# Patient Record
Sex: Male | Born: 1995 | Race: White | Hispanic: No | Marital: Married | Smoking: Never smoker
Health system: Southern US, Community
[De-identification: ages and names within clinical notes are randomized; demographics above are authoritative.]

---

## 2018-08-06 ENCOUNTER — Emergency Department (HOSPITAL_COMMUNITY)
Admission: EM | Admit: 2018-08-06 | Discharge: 2018-08-06 | Disposition: A | Discharge status: Home / Self Care | Payer: No Typology Code available for payment source | Attending: Emergency Medicine | Admitting: Emergency Medicine

## 2018-08-06 ENCOUNTER — Emergency Department (HOSPITAL_COMMUNITY): Payer: No Typology Code available for payment source

## 2018-08-06 ENCOUNTER — Encounter (HOSPITAL_COMMUNITY): Payer: Self-pay | Admitting: *Deleted

## 2018-08-06 DIAGNOSIS — Y939 Activity, unspecified: Secondary | ICD-10-CM | POA: Insufficient documentation

## 2018-08-06 DIAGNOSIS — W3189XA Contact with other specified machinery, initial encounter: Secondary | ICD-10-CM | POA: Insufficient documentation

## 2018-08-06 DIAGNOSIS — S8991XA Unspecified injury of right lower leg, initial encounter: Secondary | ICD-10-CM | POA: Diagnosis present

## 2018-08-06 DIAGNOSIS — Y999 Unspecified external cause status: Secondary | ICD-10-CM | POA: Diagnosis not present

## 2018-08-06 DIAGNOSIS — Y929 Unspecified place or not applicable: Secondary | ICD-10-CM | POA: Diagnosis not present

## 2018-08-06 DIAGNOSIS — Z23 Encounter for immunization: Secondary | ICD-10-CM | POA: Diagnosis not present

## 2018-08-06 DIAGNOSIS — S81811A Laceration without foreign body, right lower leg, initial encounter: Secondary | ICD-10-CM

## 2018-08-06 MED ORDER — TETANUS-DIPHTH-ACELL PERTUSSIS 5-2.5-18.5 LF-MCG/0.5 IM SUSP
0.5000 mL | Freq: Once | INTRAMUSCULAR | Status: AC
  Administered 2018-08-06: 0.5 mL via INTRAMUSCULAR
  Filled 2018-08-06: qty 0.5

## 2018-08-06 MED ORDER — LIDOCAINE HCL (PF) 1 % IJ SOLN
10.0000 mL | Freq: Once | INTRAMUSCULAR | Status: AC
  Administered 2018-08-06: 10 mL
  Filled 2018-08-06: qty 10

## 2018-08-06 NOTE — Discharge Instructions (Addendum)
Your evaluated in the emergency department for a laceration to your right leg that happened while you are at work.  Your x-ray did not show any obvious fracture or foreign body.  Your wound was sutured closed and the sutures will need to be removed in 10 to 12 days.  You may return to work tomorrow full duty.  Please return sooner if any signs of infection.

## 2018-08-06 NOTE — ED Triage Notes (Signed)
Pt in with laceration to his right knee, bleeding controlled, it was hit by a drill bit while he was trying to drill a whole

## 2018-08-06 NOTE — ED Notes (Signed)
Pt walked out prior to receiving d.c papers, unable to locate pt.

## 2018-08-06 NOTE — ED Provider Notes (Signed)
[facility] EMERGENCY DEPARTMENT Provider Note   CSN: [id number] Arrival date & time: 08/06/18  1222     History   Chief Complaint Chief Complaint  Patient presents with  . Laceration    HPI Danny Owens is a 23 y.o. male.  He is here for an injury from work.  He says he was using a drill that skipped and cut his right leg above his knee.  This happened just prior to arrival.  He is complaining of no pain and no active bleeding.  No distal numbness or weakness.  No difficulty with ambulation.  He is unsure when his last tetanus shot was.  The history is provided by the patient.  Laceration   The incident occurred 1 to 2 hours ago. The laceration is located on the right leg. The laceration is 3 cm in size. Injury mechanism: drill bit. The patient is experiencing no pain. He reports no foreign bodies present. His tetanus status is unknown.    History reviewed. No pertinent past medical history.  There are no active problems to display for this patient.   History reviewed. No pertinent surgical history.      Home Medications    Prior to Admission medications   Not on File    Family History History reviewed. No pertinent family history.  Social History Social History   Tobacco Use  . Smoking status: Not on file  Substance Use Topics  . Alcohol use: Not on file  . Drug use: Not on file     Allergies   Patient has no known allergies.   Review of Systems Review of Systems  Respiratory: Negative for shortness of breath.   Skin: Positive for wound.  Neurological: Negative for weakness and numbness.     Physical Exam Updated Vital Signs BP (!) 155/90 (BP Location: Right Arm)   Pulse 99   Temp 99.7 F (37.6 C) (Oral)   Resp 14   Ht 5\' 8"  (1.727 m)   Wt 97.5 kg   SpO2 98%   BMI 32.69 kg/m   Physical Exam Vitals signs and nursing note reviewed.  Constitutional:      Appearance: He is well-developed.  HENT:     Head:  Normocephalic and atraumatic.  Eyes:     Conjunctiva/sclera: Conjunctivae normal.  Neck:     Musculoskeletal: Neck supple.  Pulmonary:     Effort: Pulmonary effort is normal.  Musculoskeletal: Normal range of motion.        General: Signs of injury present.     Comments: Is approximately 3 and half centimeter laceration just proximal to his right knee.  Extensor mechanism intact.  No obvious foreign body.  No active bleeding.  Skin:    General: Skin is warm and dry.  Neurological:     Mental Status: He is alert.     GCS: GCS eye subscore is 4. GCS verbal subscore is 5. GCS motor subscore is 6.      ED Treatments / Results  Labs (all labs ordered are listed, but only abnormal results are displayed) Labs Reviewed - No data to display  EKG None  Radiology No results found.  Procedures .Marland KitchenLaceration Repair Date/Time: 08/06/2018 2:05 PM Performed by: Terrilee Files, MD Authorized by: Terrilee Files, MD   Consent:    Consent obtained:  Verbal   Consent given by:  Patient   Risks discussed:  Infection, pain, poor cosmetic result, poor wound healing and retained foreign body  Alternatives discussed:  No treatment and delayed treatment Anesthesia (see MAR for exact dosages):    Anesthesia method:  Local infiltration   Local anesthetic:  Lidocaine 1% w/o epi Laceration details:    Location:  Leg   Leg location:  L upper leg   Length (cm):  3.5 Repair type:    Repair type:  Simple Pre-procedure details:    Preparation:  Patient was prepped and draped in usual sterile fashion and imaging obtained to evaluate for foreign bodies Exploration:    Wound exploration: wound explored through full range of motion     Contaminated: no   Treatment:    Area cleansed with:  Saline   Amount of cleaning:  Standard Skin repair:    Repair method:  Sutures   Suture size:  3-0   Suture material:  Nylon   Suture technique:  Simple interrupted   Number of sutures:  4 Approximation:     Approximation:  Close Post-procedure details:    Dressing:  Non-adherent dressing   Patient tolerance of procedure:  Tolerated well, no immediate complications   (including critical care time)  Medications Ordered in ED Medications  lidocaine (PF) (XYLOCAINE) 1 % injection 10 mL (has no administration in time range)  Tdap (BOOSTRIX) injection 0.5 mL (has no administration in time range)     Initial Impression / Assessment and Plan / ED Course  I have reviewed the triage vital signs and the nursing notes.  Pertinent labs & imaging results that were available during my care of the patient were reviewed by me and considered in my medical decision making (see chart for details).       Final Clinical Impressions(s) / ED Diagnoses   Final diagnoses:  Laceration of right lower extremity, initial encounter    ED Discharge Orders    None       Terrilee FilesButler, [place]op[place] Canty C, MD 08/07/18 [phone]

## 2018-08-06 NOTE — ED Notes (Signed)
ED Provider at bedside. 

## 2018-08-07 ENCOUNTER — Encounter (HOSPITAL_COMMUNITY): Payer: Self-pay | Admitting: Emergency Medicine

## 2018-08-18 ENCOUNTER — Other Ambulatory Visit: Payer: Self-pay

## 2018-08-18 ENCOUNTER — Emergency Department (HOSPITAL_COMMUNITY)
Admission: EM | Admit: 2018-08-18 | Discharge: 2018-08-18 | Disposition: A | Discharge status: Home / Self Care | Payer: No Typology Code available for payment source | Attending: Emergency Medicine | Admitting: Emergency Medicine

## 2018-08-18 DIAGNOSIS — Z4802 Encounter for removal of sutures: Secondary | ICD-10-CM | POA: Insufficient documentation

## 2018-08-18 NOTE — ED Provider Notes (Signed)
  [facility] EMERGENCY DEPARTMENT Provider Note   CSN: [id number] Arrival date & time: 08/18/18  1623     History   Chief Complaint Chief Complaint  Patient presents with  . Suture / Staple Removal    HPI Danny Owens is a 23 y.o. male.  The history is provided by the patient. No language interpreter was used.  Suture / Staple Removal  This is a new problem. Episode onset: 2 weeks. The problem occurs constantly. He has tried nothing for the symptoms.  Pt reports no complaints  No past medical history on file.  There are no active problems to display for this patient.   No past surgical history on file.      Home Medications    Prior to Admission medications   Not on File    Family History No family history on file.  Social History Social History   Tobacco Use  . Smoking status: Not on file  Substance Use Topics  . Alcohol use: Not on file  . Drug use: Not on file     Allergies   Patient has no known allergies.   Review of Systems Review of Systems  All other systems reviewed and are negative.    Physical Exam Updated Vital Signs BP (!) 140/91 (BP Location: Right Arm)   Pulse 79   Temp 98.2 F (36.8 C) (Oral)   Resp 16   SpO2 96%   Physical Exam Vitals signs and nursing note reviewed.  Musculoskeletal: Normal range of motion.  Skin:    General: Skin is warm.     Comments: Laceration well healed   Neurological:     General: No focal deficit present.  Psychiatric:        Mood and Affect: Mood normal.      ED Treatments / Results  Labs (all labs ordered are listed, but only abnormal results are displayed) Labs Reviewed - No data to display  EKG None  Radiology No results found.  Procedures Procedures (including critical care time)  Medications Ordered in ED Medications - No data to display   Initial Impression / Assessment and Plan / ED Course  I have reviewed the triage vital signs and the  nursing notes.  Pertinent labs & imaging results that were available during my care of the patient were reviewed by me and considered in my medical decision making (see chart for details).       Final Clinical Impressions(s) / ED Diagnoses   Final diagnoses:  Visit for suture removal    ED Discharge Orders    None       Osie Cheeks 08/18/18 1633    Mesner, Barbara Cower, MD 08/20/18 [phone]

## 2018-08-18 NOTE — ED Triage Notes (Signed)
Pt here for stitches to be removed from lac on right leg two weeks ago. No complaints.

## 2018-08-18 NOTE — ED Notes (Signed)
Patient verbalizes understanding of discharge instructions. Opportunity for questioning and answers were provided. Armband removed by staff, pt discharged from ED.  

## 2019-05-12 IMAGING — CR DG KNEE COMPLETE 4+V*R*
4 series · 4 of 4 positions shown · non-contrast
Comparison: None.

CLINICAL DATA: Anterior right knee laceration while at work.

EXAM:
RIGHT KNEE - COMPLETE 4+ VIEW

[knee ap]
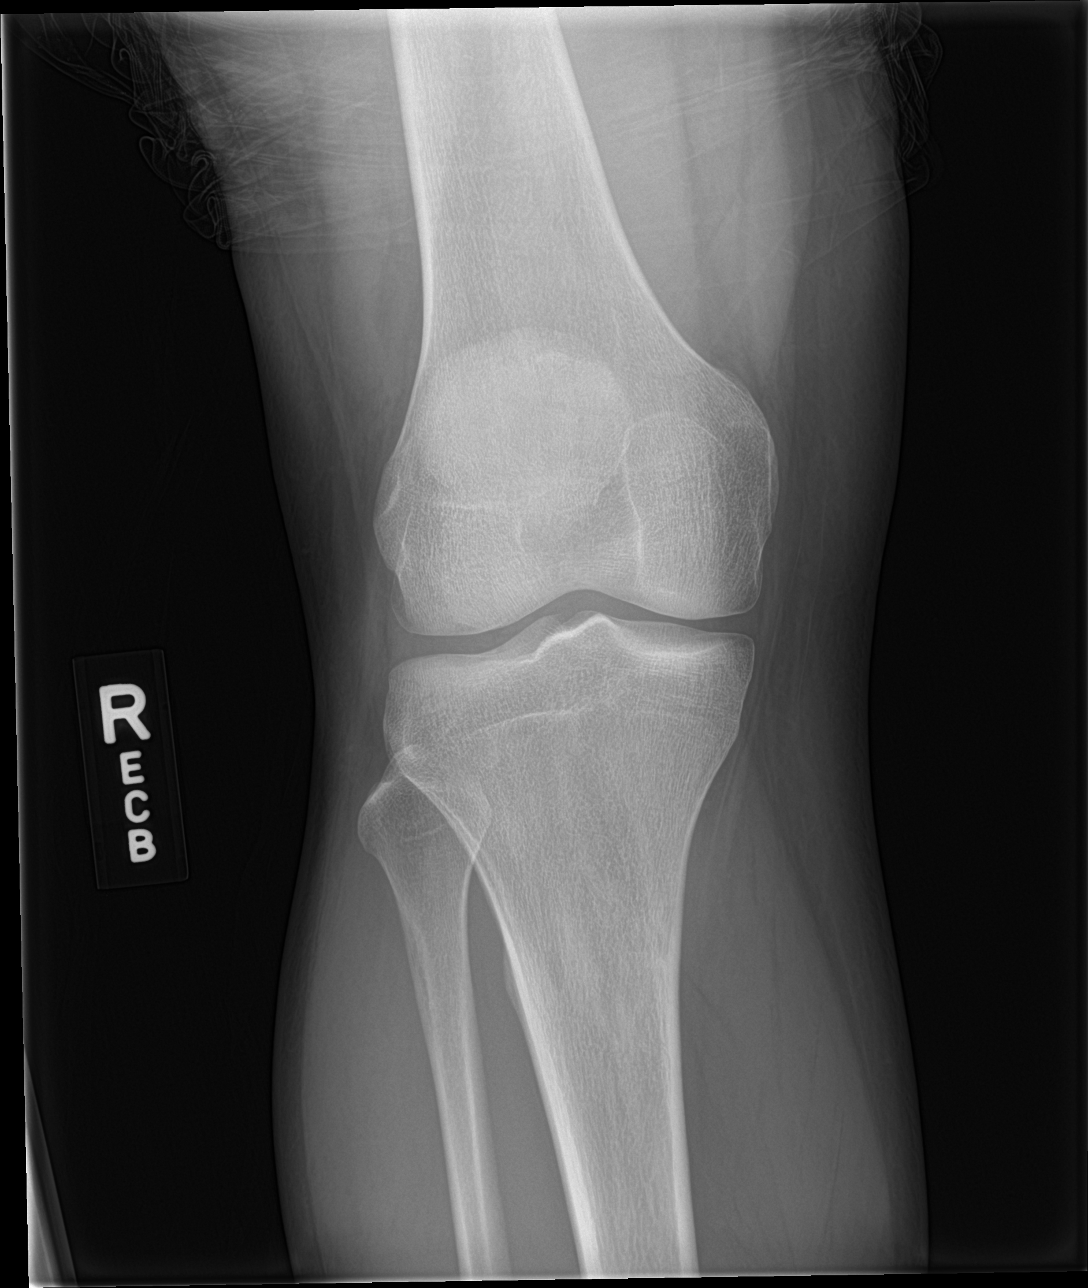

[knee lat]
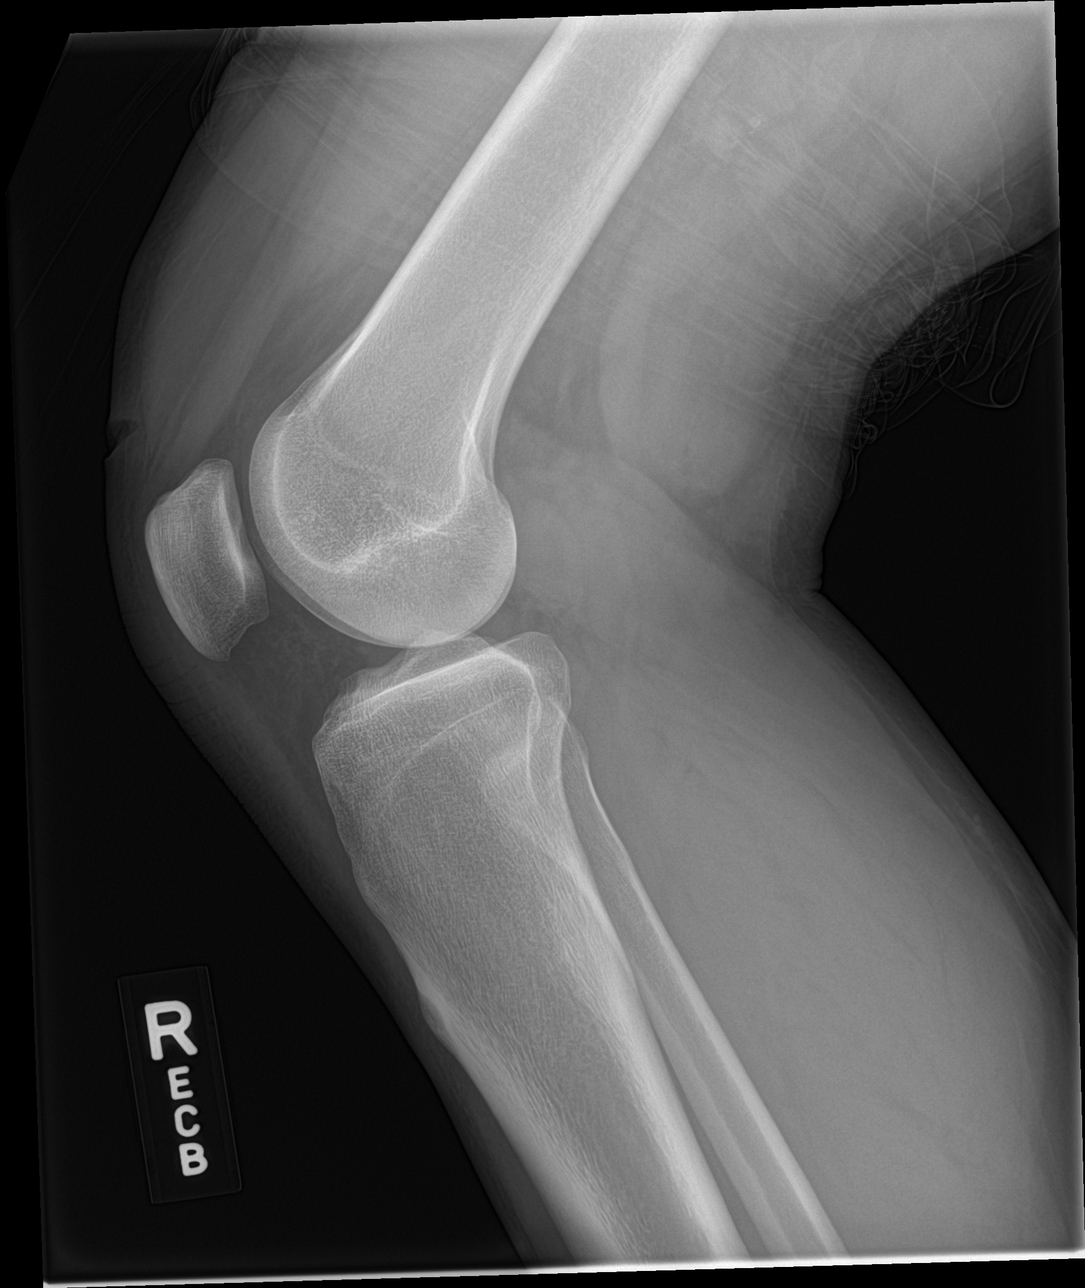

[knee obl (1 of 2)]
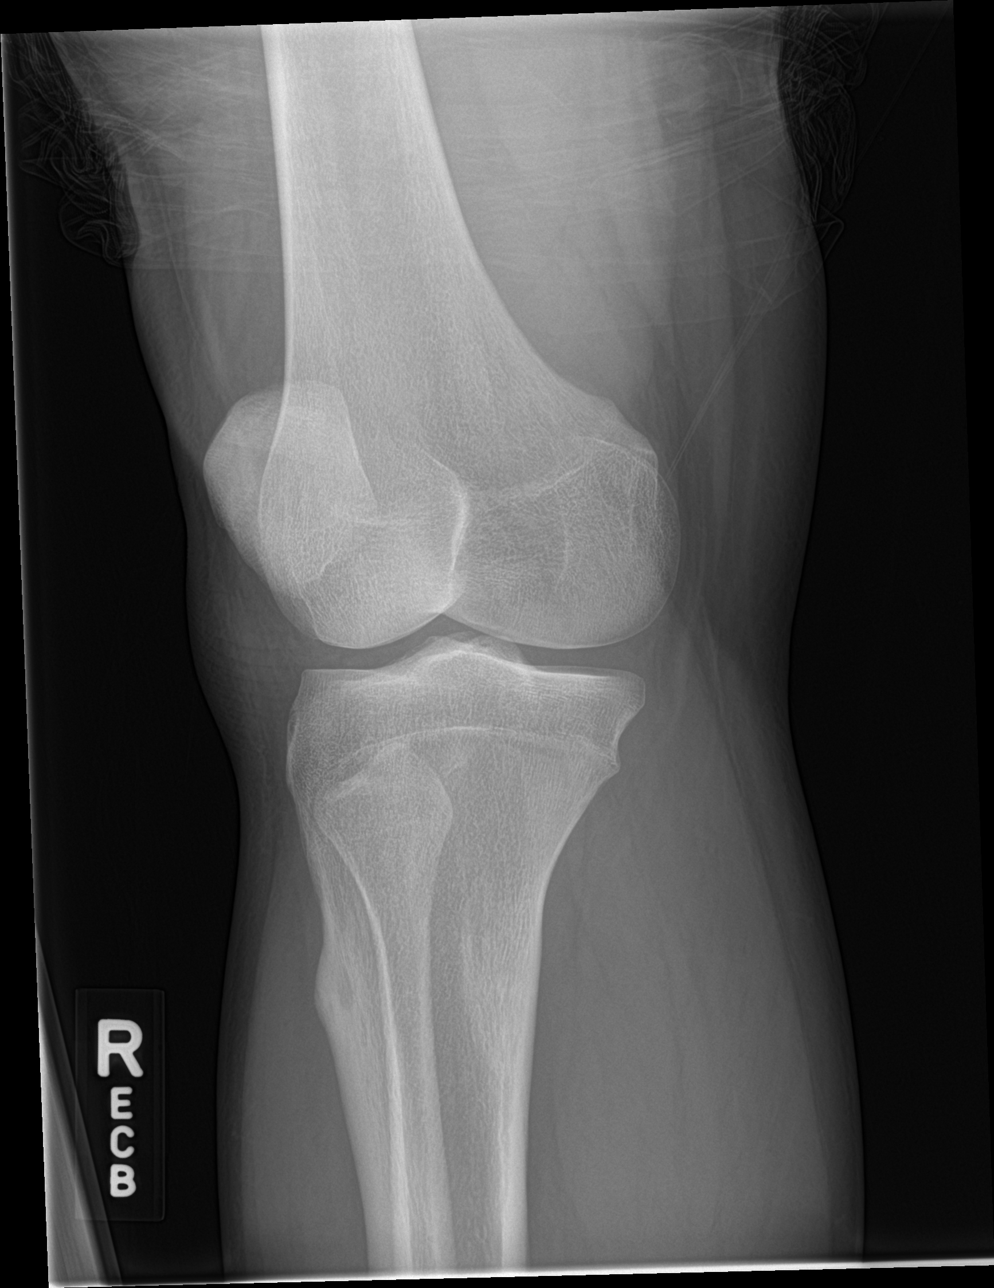

[knee obl (2 of 2)]
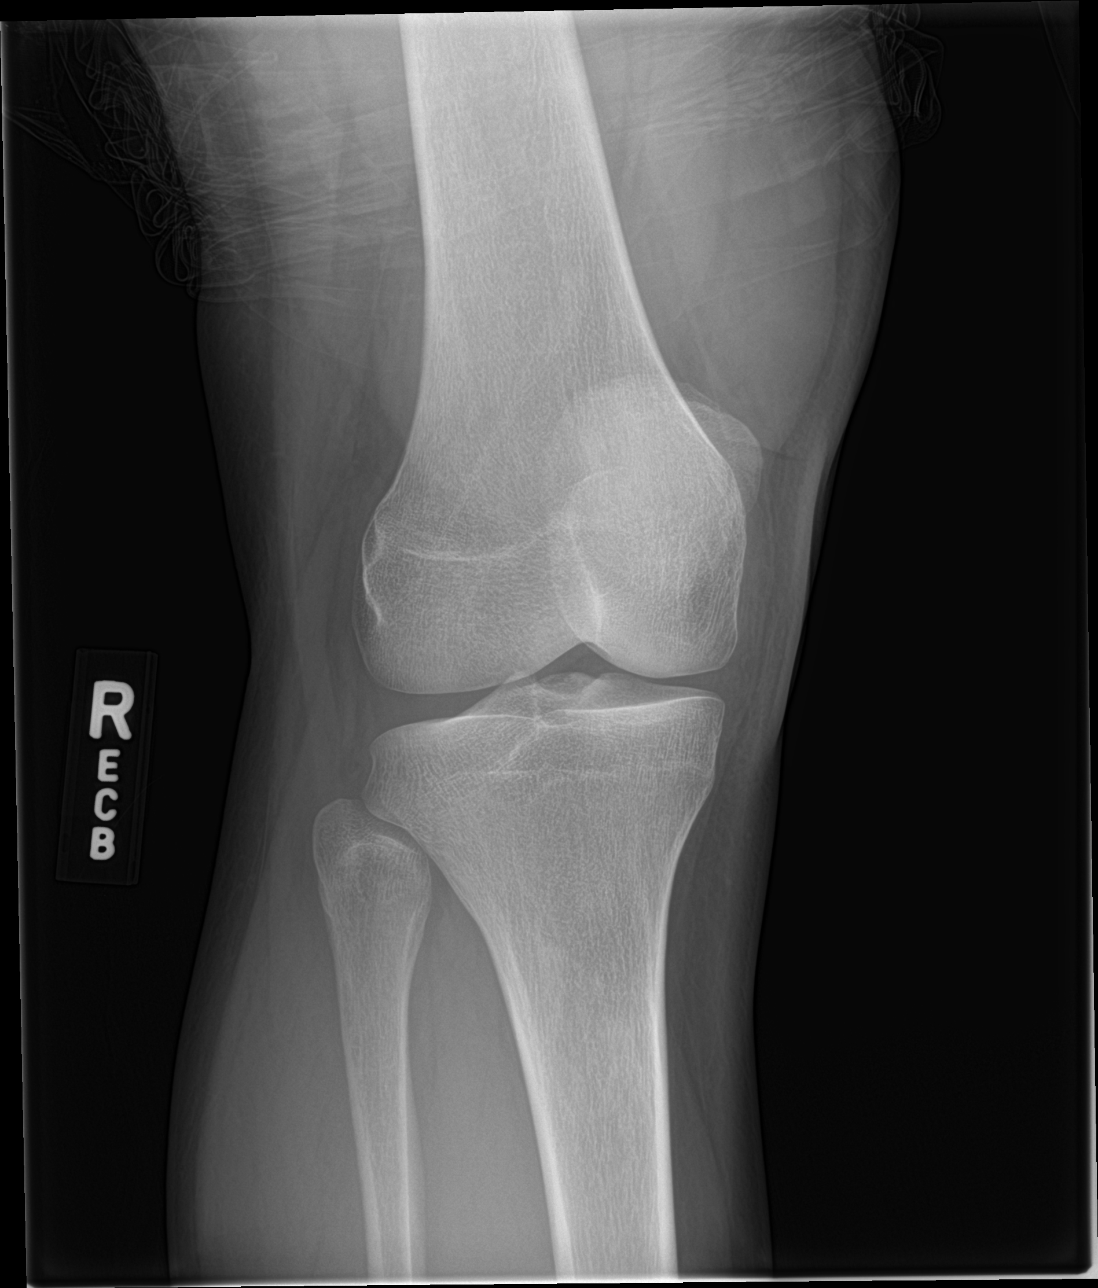

[4 of 4 positions shown; findings below may reference images not displayed]

FINDINGS: The bones are subjectively adequately mineralized. The cutaneous
laceration is visible above the patella on lateral view. No bony
abnormality is observed. The joint spaces are well maintained. There
is no joint effusion.
IMPRESSION: Cutaneous laceration above the level of the patella. No acute bony
abnormality of the knee.

## 2019-08-27 NOTE — Progress Notes (Signed)
 [facility] NEW PATIENT EVALUATION   Referring Physician:  No ref. provider found Primary Care Physician:  No primary care provider on file.   Patient ID:  Danny Owens is a 24 y.o. (DOB 1995-10-08) male.     Subjective   HPI:  Mr. Danny Owens is a 24 y.o. male referred for sensory disturbances.  Patient states onset of symptoms were in December describing my eyes feel funny.  He states that when he looks up at the light he experiences photophobia does not make him nauseous however he does feel intense pressure and stiffness behind his eyes.  Denies any symptoms of diplopia or blurred vision.  Denies any symptoms of vertigo or tinnitus.  States that the events occur frequently unsure how long it lasts.  It does not wake him up out of sleep.  Denies any symptoms of weakness numbness tingling in the extremities.  Patient has a past medical history significant for elevated blood pressure for which he is on medication.  Past Medical History, Past Surgery History, Social History, and Family History were reviewed and updated.    No past medical history on file.  No past surgical history on file. No family history on file. Social History   Socioeconomic History  . Marital status: Single    Spouse name: Not on file  . Number of children: Not on file  . Years of education: Not on file  . Highest education level: Not on file  Occupational History  . Not on file  Tobacco Use  . Smoking status: Never Smoker  . Smokeless tobacco: Never Used  Substance and Sexual Activity  . Alcohol use: Yes    Comment: occasionally  . Drug use: Never  . Sexual activity: Not on file  Other Topics Concern  . Not on file  Social History Narrative  . Not on file   Social Determinants of Health   Financial Resource Strain:   . Difficulty of Paying Living Expenses: Not on file  Food Insecurity:   . Worried About Programme researcher, broadcasting/film/video in the Last Year: Not on file  . Ran Out  of Food in the Last Year: Not on file  Transportation Needs:   . Lack of Transportation (Medical): Not on file  . Lack of Transportation (Non-Medical): Not on file  Physical Activity:   . Days of Exercise per Week: Not on file  . Minutes of Exercise per Session: Not on file  Stress:   . Feeling of Stress : Not on file  Social Connections:   . Frequency of Communication with Friends and Family: Not on file  . Frequency of Social Gatherings with Friends and Family: Not on file  . Attends Religious Services: Not on file  . Active Member of Clubs or Organizations: Not on file  . Attends Banker Meetings: Not on file  . Marital Status: Not on file  Intimate Partner Violence:   . Fear of Current or Ex-Partner: Not on file  . Emotionally Abused: Not on file  . Physically Abused: Not on file  . Sexually Abused: Not on file      Current Medications    Medication Sig   atenolol (TENORMIN) 25 mg tablet Take 25 mg by mouth daily.   lisinopril (PRINIVIL,ZESTRIL) 10 mg tablet Take 10 mg by mouth daily.   pravastatin sodium (PRAVACHOL) 40 mg tablet Take 40 mg by mouth daily.   nortriptyline HCl (PAMELOR) 10 mg capsule Take three capsules (30 mg  dose) by mouth at bedtime.     The patient has No Known Allergies.  Review of Systems is complete and negative except as noted in History of Present Illness.  Objective    PHYSICAL EXAM: BP 129/83 (BP Location: Right arm, Patient Position: Standing)   Pulse 77   Resp 16   Ht 5' 8 (1.727 m)   Wt 229 lb (103.9 kg)   BMI 34.82 kg/m  GENERAL:  No acute distress.  EYES:   Pupils: pupils equally round, reactive to light.  ENT:   Throat: oropharynx clear.  CARDIOVASCULAR:   Palpation/Auscultation: regular rate and rhythm.  RESPIRATORY:   clear to auscultation bilaterally.  GASTROINTESTINAL:   Abdomen: benign, bowel sounds present.  SKIN:   Inspection: well perfused, no edema.   MENTAL STATUS EXAM: Orientation: Alert and oriented  to person, place and time. Memory: Cooperative, follows commands well. Recent and remote memory normal.. Attention, concentration: Attention span and concentration are normal. Language: Speech is clear and language is normal. Fund of knowledge: Aware of current events, vocabulary appropriate for patient age.   CRANIAL NERVES: CN 2 (Optic): Visual fields intact to confrontation, funduscopic examination without optic disk pallor or edema, retinal vessels are normal. CN 3,4,6 (EOM): Pupils equal and reactive to light. Full extraocular eye movement without nystagmus. CN 5 (Trigeminal): Facial sensation is normal, no weakness of masticatory muscles. CN 7 (Facial): No facial weakness or asymmetry. CN 8 (Auditory): Auditory acuity grossly normal. CN 9,10 (Glossophar): The uvula is midline, the palate elevates symmetrically. CN 11 (spinal access): Normal sternocleidomastoid and trapezius strength. CN 12 (Hypoglossal): The tongue is midline. No atrophy or fasciculations.   MOTOR: Muscle Strength: Strength - 5/5 and symmetric in the upper and lower extremities, no pronation or drift. Muscle Tone: Tone and muscle bulk are normal in the upper and lower extremities.   REFLEXES:   DTRs - 2+ and symmetrical in all four extremities, plantar responses are flexor bilaterally.   COORDINATION:   Intact finger-to-nose, heel-to-shin, and rapid alternating movements, no tremor.   SENSATION:  Intact to light touch, vibration, pinprick.  No sensory extinction to DSS. Negative Romberg test.  GAIT: Routine and tandem gait are normal.     Assessment   24 y.o. male with PMHx as above who presents for evaluation of photophobia with retro-orbital pressure.  No focal findings appreciated on today's exam.  Differential diagnosis at this time would include atypical migraines versus idiopathic intracranial hypertension.  Hypertensive urgency/emergency is also on the differential given the history of high blood pressure  however he is on blood pressure medications at this time, blood pressure normalized today.  Patient has no family history of migraines.  He also has no visual disturbances.  We will start at this time with a low-dose of nortriptyline 30 mg daily.  Recommended to hold on imaging at this time given his exam has no evidence of intracranial abnormalities or localization.  There is also no evidence of visual disturbances.  Explained patient that this changes will then order an MRI at that time.  Plan   Atypical migraines versus I IH: -Nortriptyline 30 mg daily -If symptoms progress may consider brain MRI    Risks, benefits, and alternatives of the medications and treatment plan prescribed today were discussed, and patient expressed understanding and agreement with the plan.  All new prescription medications and changes in current prescription dosages were discussed with the patient, including patient education, medication name, use, dosage, potential side effects, drug interactions, consequences  of not using/taking and special instructions. Patient expressed understanding. No barriers to adherence.  Follow up in about 4 months (around 12/25/2019).   Total time that I spent today was 60 minutes of which greater than 50% of the time was spent with the patient face-to-face reviewing and discussing her findings, evaluating medical condition and management issues.  Multiple questions were answered. *This note was dictated with voice recognition software. Inadvertently, similar sounding words can, sometimes, get transcribed incorrectly

## 2020-01-18 DIAGNOSIS — E860 Dehydration: Secondary | ICD-10-CM | POA: Insufficient documentation

## 2020-01-18 DIAGNOSIS — N179 Acute kidney failure, unspecified: Secondary | ICD-10-CM | POA: Insufficient documentation

## 2021-08-29 DIAGNOSIS — E785 Hyperlipidemia, unspecified: Secondary | ICD-10-CM | POA: Insufficient documentation

## 2021-08-29 DIAGNOSIS — I10 Essential (primary) hypertension: Secondary | ICD-10-CM | POA: Insufficient documentation

## 2024-02-03 ENCOUNTER — Ambulatory Visit: Payer: Self-pay | Admitting: Urgent Care

## 2024-02-03 ENCOUNTER — Encounter: Payer: Self-pay | Admitting: Urgent Care

## 2024-02-03 VITALS — BP 142/87 | HR 74 | Resp 18 | Ht 68.0 in | Wt 232.0 lb

## 2024-02-03 DIAGNOSIS — E790 Hyperuricemia without signs of inflammatory arthritis and tophaceous disease: Secondary | ICD-10-CM

## 2024-02-03 DIAGNOSIS — I10 Essential (primary) hypertension: Secondary | ICD-10-CM

## 2024-02-03 DIAGNOSIS — E782 Mixed hyperlipidemia: Secondary | ICD-10-CM

## 2024-02-03 DIAGNOSIS — Z23 Encounter for immunization: Secondary | ICD-10-CM | POA: Diagnosis not present

## 2024-02-03 MED ORDER — ROSUVASTATIN CALCIUM 20 MG PO TABS: 20.0000 mg | ORAL_TABLET | Freq: Every day | ORAL | 1 refills | Status: DC

## 2024-02-03 MED ORDER — TELMISARTAN 20 MG PO TABS: 20.0000 mg | ORAL_TABLET | Freq: Every day | ORAL | 0 refills | Status: DC

## 2024-02-03 NOTE — Progress Notes (Unsigned)
 New Patient Office Visit  Subjective:  Patient ID: Danny Owens, male    DOB: 11/07/1995  Age: 28 y.o. MRN: [id number]  CC:  Chief Complaint  Patient presents with  . New Patient (Initial Visit)    Est. Care pt states he had BW done in March at [facility]    HPI Danny Abdullah presents to establish care.   ***  Outpatient Encounter Medications as of 02/03/2024  Medication Sig  . amLODipine (NORVASC) 10 MG tablet Take 10 mg by mouth daily.  SABRA EPINEPHrine 0.3 mg/0.3 mL IJ SOAJ injection SMARTSIG:1 Syringe(s) IM  . fluticasone (FLONASE) 50 MCG/ACT nasal spray SMARTSIG:2 Spray(s) Both Nares  . REPATHA 140 MG/ML SOSY SMARTSIG:140 Milligram(s) SUB-Q Every 2 Weeks  . rosuvastatin (CRESTOR) 20 MG tablet Take 1 tablet (20 mg total) by mouth daily.  SABRA telmisartan (MICARDIS) 20 MG tablet Take 1 tablet (20 mg total) by mouth daily.  . [DISCONTINUED] allopurinol (ZYLOPRIM) 100 MG tablet Take 100 mg by mouth daily. PRN  . [DISCONTINUED] lisinopril (ZESTRIL) 10 MG tablet Take 10 mg by mouth.  . [DISCONTINUED] nortriptyline (PAMELOR) 10 MG capsule Take 30 mg by mouth. (Patient not taking: Reported on 02/03/2024)  . [DISCONTINUED] pravastatin (PRAVACHOL) 40 MG tablet Take 40 mg by mouth. (Patient not taking: Reported on 02/03/2024)   No facility-administered encounter medications on file as of 02/03/2024.    History reviewed. No pertinent past medical history.  History reviewed. No pertinent surgical history.  History reviewed. No pertinent family history.  Social History   Socioeconomic History  . Marital status: Single    Spouse name: Not on file  . Number of children: Not on file  . Years of education: Not on file  . Highest education level: Not on file  Occupational History  . Not on file  Tobacco Use  . Smoking status: Never  . Smokeless tobacco: Never  Substance and Sexual Activity  . Alcohol use: Yes    Comment: occasional  . Drug use: Not on file  . Sexual activity:  Not on file  Other Topics Concern  . Not on file  Social History Narrative  . Not on file   Social Drivers of Health   Financial Resource Strain: Not on file  Food Insecurity: Not on file  Transportation Needs: Not on file  Physical Activity: Not on file  Stress: Not on file  Social Connections: Unknown (12/18/2021)   Received from [facility]   Social Network   . Social Network: Not on file  Intimate Partner Violence: Unknown (11/09/2021)   Received from [facility]   HITS   . Physically Hurt: Not on file   . Insult or Talk Down To: Not on file   . Threaten Physical Harm: Not on file   . Scream or Curse: Not on file    ROS: as noted in HPI  Objective:  BP (!) 142/87 (BP Location: Left Arm, Patient Position: Sitting, Cuff Size: Large)   Pulse 74   Resp 18   Ht 5' 8 (1.727 m)   Wt 232 lb (105.2 kg)   SpO2 96%   BMI 35.28 kg/m   Physical Exam  {Labs (Optional):23779}  Assessment & Plan:  Mixed hyperlipidemia -     Rosuvastatin Calcium; Take 1 tablet (20 mg total) by mouth daily.  Dispense: 90 tablet; Refill: 1  Hyperuricemia  Hypertension, unspecified type -     Aldosterone + renin activity w/ ratio -     Cortisol -  Telmisartan; Take 1 tablet (20 mg total) by mouth daily.  Dispense: 30 tablet; Refill: 0  Encounter for immunization -     Tdap vaccine greater than or equal to 7yo IM    No follow-ups on file.   Benton LITTIE Gave, PA

## 2024-02-03 NOTE — Patient Instructions (Signed)
 Please continue your amlodipine, start telmisartan 20mg  every morning. Monitor blood pressure at home - goal readings 120/80. Send me BP results via mychart in the next two weeks.  Start crestor. Take this nightly before bed. Continue repatha.  Please schedule a 3 month follow up FASTING to recheck labs.  If your blood pressure remains elevated despite todays medication changes, then please follow up in 3-4 weeks.

## 2024-02-04 ENCOUNTER — Encounter: Payer: Self-pay | Admitting: Urgent Care

## 2024-02-12 ENCOUNTER — Ambulatory Visit: Payer: Self-pay | Admitting: Urgent Care

## 2024-02-12 LAB — ALDOSTERONE + RENIN ACTIVITY W/ RATIO
Aldos/Renin Ratio: 3.6 (ref 0.0–30.0)
Aldosterone: 14.4 ng/dL (ref 0.0–30.0)
Renin Activity, Plasma: 3.954 ng/mL/h (ref 0.167–5.380)

## 2024-02-12 LAB — CORTISOL: Cortisol: 10.2 ug/dL (ref 6.2–19.4)

## 2024-03-03 ENCOUNTER — Encounter: Payer: Self-pay | Admitting: Urgent Care

## 2024-03-05 ENCOUNTER — Other Ambulatory Visit: Payer: Self-pay

## 2024-03-05 DIAGNOSIS — I10 Essential (primary) hypertension: Secondary | ICD-10-CM

## 2024-03-05 MED ORDER — TELMISARTAN 20 MG PO TABS: 20.0000 mg | ORAL_TABLET | Freq: Every day | ORAL | 1 refills | Status: DC

## 2024-03-30 ENCOUNTER — Other Ambulatory Visit: Payer: Self-pay | Admitting: Urgent Care

## 2024-03-30 DIAGNOSIS — I10 Essential (primary) hypertension: Secondary | ICD-10-CM

## 2024-05-05 ENCOUNTER — Encounter: Payer: Self-pay | Admitting: Urgent Care

## 2024-05-05 ENCOUNTER — Ambulatory Visit: Admitting: Urgent Care

## 2024-05-05 VITALS — BP 120/75 | HR 63 | Ht 68.0 in | Wt 242.0 lb

## 2024-05-05 DIAGNOSIS — E782 Mixed hyperlipidemia: Secondary | ICD-10-CM | POA: Diagnosis not present

## 2024-05-05 DIAGNOSIS — R0683 Snoring: Secondary | ICD-10-CM | POA: Diagnosis not present

## 2024-05-05 DIAGNOSIS — Z6836 Body mass index (BMI) 36.0-36.9, adult: Secondary | ICD-10-CM | POA: Diagnosis not present

## 2024-05-05 DIAGNOSIS — R5383 Other fatigue: Secondary | ICD-10-CM

## 2024-05-05 DIAGNOSIS — I10 Essential (primary) hypertension: Secondary | ICD-10-CM

## 2024-05-05 DIAGNOSIS — R0981 Nasal congestion: Secondary | ICD-10-CM

## 2024-05-05 DIAGNOSIS — Z7182 Exercise counseling: Secondary | ICD-10-CM

## 2024-05-05 NOTE — Progress Notes (Unsigned)
   Established Patient Office Visit  Subjective:  Patient ID: Danny Owens, male    DOB: August 22, 1995  Age: 28 y.o. MRN: [id number]  Chief Complaint  Patient presents with   Follow-up    labs    HPI  {History (Optional):23778}  ROS: as noted in HPI  Objective:     BP 120/75   Pulse 63   Ht 5' 8 (1.727 m)   Wt 242 lb (109.8 kg)   SpO2 99%   BMI 36.80 kg/m  BP Readings from Last 3 Encounters:  05/05/24 120/75  02/03/24 (!) 142/87  08/18/18 (!) 140/91   Wt Readings from Last 3 Encounters:  05/05/24 242 lb (109.8 kg)  02/03/24 232 lb (105.2 kg)  08/06/18 215 lb (97.5 kg)      Physical Exam   No results found for any visits on 05/05/24.  {Labs (Optional):23779}  The ASCVD Risk score (Arnett DK, et al., 2019) failed to calculate for the following reasons:   The 2019 ASCVD risk score is only valid for ages 75 to 63  Assessment & Plan:  There are no diagnoses linked to this encounter.   No follow-ups on file.   Benton LITTIE Gave, PA

## 2024-05-05 NOTE — Patient Instructions (Addendum)
 Continue your amlodipine and telmisartan . Continue your crestor  and repatha.  I have ordered a home sleep study through [facility]. Please call the number below if you do not receive a call. Call: [phone]  Use flonase or as needed afrin to help with nasal congestion.  Please work on increasing protein and fiber, increase water intake. Let me know if you end up wanting a referral or to consider medication for weight.  Increase physical activity.  Return FASTING for labs,

## 2024-05-06 MED ORDER — TELMISARTAN 20 MG PO TABS: 20.0000 mg | ORAL_TABLET | Freq: Every day | ORAL | 2 refills | Status: AC

## 2024-05-06 MED ORDER — AMLODIPINE BESYLATE 10 MG PO TABS: 10.0000 mg | ORAL_TABLET | Freq: Every day | ORAL | 2 refills | Status: AC

## 2024-05-09 ENCOUNTER — Ambulatory Visit: Payer: Self-pay | Admitting: Urgent Care

## 2024-05-09 LAB — B12 AND FOLATE PANEL
Folate: >20 ng/mL (ref >3.0)
Vitamin B-12: 1026 pg/mL (ref 232–1245)

## 2024-05-09 LAB — CBC WITH DIFFERENTIAL/PLATELET
Basophils Absolute: 0.1 x10E3/uL (ref 0.0–0.2)
Basos: 1 %
EOS (ABSOLUTE): 0.1 x10E3/uL (ref 0.0–0.4)
Eos: 1 %
Hematocrit: 42.7 % (ref 37.5–51.0)
Hemoglobin: 14.9 g/dL (ref 13.0–17.7)
Immature Grans (Abs): 0 x10E3/uL (ref 0.0–0.1)
Immature Granulocytes: 0 %
Lymphocytes Absolute: 3.3 x10E3/uL — ABNORMAL HIGH (ref 0.7–3.1)
Lymphs: 50 %
MCH: 31.4 pg (ref 26.6–33.0)
MCHC: 34.9 g/dL (ref 31.5–35.7)
MCV: 90 fL (ref 79–97)
Monocytes Absolute: 0.5 x10E3/uL (ref 0.1–0.9)
Monocytes: 8 %
Neutrophils Absolute: 2.6 x10E3/uL (ref 1.4–7.0)
Neutrophils: 40 %
Platelets: 350 x10E3/uL (ref 150–450)
RBC: 4.74 x10E6/uL (ref 4.14–5.80)
RDW: 12.4 % (ref 11.6–15.4)
WBC: 6.5 x10E3/uL (ref 3.4–10.8)

## 2024-05-09 LAB — COMPREHENSIVE METABOLIC PANEL WITH GFR
ALT: 62 IU/L — ABNORMAL HIGH (ref 0–44)
AST: 37 IU/L (ref 0–40)
Albumin: 4.9 g/dL (ref 4.3–5.2)
Alkaline Phosphatase: 84 IU/L (ref 47–123)
BUN/Creatinine Ratio: 22 — ABNORMAL HIGH (ref 9–20)
BUN: 23 mg/dL — ABNORMAL HIGH (ref 6–20)
Bilirubin Total: 0.7 mg/dL (ref 0.0–1.2)
CO2: 24 mmol/L (ref 20–29)
Calcium: 10.1 mg/dL (ref 8.7–10.2)
Chloride: 101 mmol/L (ref 96–106)
Creatinine, Ser: 1.03 mg/dL (ref 0.76–1.27)
Globulin, Total: 2.6 g/dL (ref 1.5–4.5)
Glucose: 92 mg/dL (ref 70–99)
Potassium: 4.6 mmol/L (ref 3.5–5.2)
Sodium: 140 mmol/L (ref 134–144)
Total Protein: 7.5 g/dL (ref 6.0–8.5)
eGFR: 101 mL/min/1.73 (ref >59)

## 2024-05-09 LAB — HEMOGLOBIN A1C
Est. average glucose Bld gHb Est-mCnc: 105 mg/dL
Hgb A1c MFr Bld: 5.3 % (ref 4.8–5.6)

## 2024-05-09 LAB — LIPID PANEL
Chol/HDL Ratio: 3.2 ratio (ref 0.0–5.0)
Cholesterol, Total: 118 mg/dL (ref 100–199)
HDL: 37 mg/dL — ABNORMAL LOW (ref >39)
LDL Chol Calc (NIH): 60 mg/dL (ref 0–99)
Triglycerides: 114 mg/dL (ref 0–149)
VLDL Cholesterol Cal: 21 mg/dL (ref 5–40)

## 2024-05-09 LAB — VITAMIN D 25 HYDROXY (VIT D DEFICIENCY, FRACTURES): Vit D, 25-Hydroxy: 35.5 ng/mL (ref 30.0–100.0)

## 2024-05-09 LAB — TESTOSTERONE,FREE AND TOTAL
Testosterone, Free: 11.2 pg/mL (ref 9.3–26.5)
Testosterone: 435 ng/dL (ref 264–916)

## 2024-05-09 LAB — TSH: TSH: 3.74 u[IU]/mL (ref 0.450–4.500)

## 2024-05-12 ENCOUNTER — Ambulatory Visit: Payer: Self-pay

## 2024-05-12 NOTE — Telephone Encounter (Signed)
 Patient reports two yellow jacket stings yesterday. One to his abdomen and the other to her thumb. Patient reports mild itching and pain. Patient requesting to be seen today due to availability. No appointment available at PCP office for today. Patient is recommended to urgent care per his request.   FYI Only or Action Required?: FYI only for provider.  Patient was last seen in primary care on 05/05/2024 by Lowella Benton CROME, PA.  Called Nurse Triage reporting Insect Bite.  Symptoms began yesterday.  Interventions attempted: Rest, hydration, or home remedies.  Symptoms are: unchanged.  Triage Disposition: See Physician Within 24 Hours  Patient/caregiver understands and will follow disposition?: Yes  Copied from CRM #[id number]. Topic: Clinical - Red Word Triage >> May 12, 2024 11:52 AM Myrick T wrote: Red Word that prompted transfer to Nurse Triage: patient was stung on the right side of belly and thumb on right hand. Both areas are swollen and red. Reason for Disposition  [1] Red or very tender (to touch) area AND [2] started over 24 hours after the sting  Answer Assessment - Initial Assessment Questions 1. TYPE: What type of sting was it? (e.g., bee, yellow jacket, unknown)      Yellow jacket 2. ONSET: When did it occur?      Stung yesterday by yellow jacket  3. LOCATION: Where is the sting located?  How many stings?     Stung twice-one to abdomen and once to right thumb 4. SWELLING SIZE: How big is the swelling? (e.g., inches or cm)     Thumb swelling is the top of thumb, area on abdomen is about the size of a half dollar 5. REDNESS: Is the area red or pink? If Yes, ask: What size is area of redness? (e.g., inches or cm). When did the redness start?     red 6. PAIN: Is there any pain? If Yes, ask: How bad is it?  (Scale 0-10; or none, mild, moderate, severe)     More pain on thumb 7. ITCHING: Is there any itching? If Yes, ask: How bad is it?      Yes-mild   8. RESPIRATORY DISTRESS: Describe your breathing.     No issues breathing 9. PRIOR REACTIONS: Have you had any severe allergic reactions to stings in the past? If Yes, ask: What happened?     no 10. OTHER SYMPTOMS: Do you have any other symptoms? (e.g., abdomen pain, face or tongue swelling, new rash elsewhere, vomiting)       no  Protocols used: Bee or Yellow Jacket Sting-A-AH

## 2024-05-13 NOTE — Telephone Encounter (Signed)
 Spoke with patient. He states swelling is starting to go down .  He denies swelling of tongue or throat, denies difficulty breathing or any known allergies to insect stings.  He would like to hold off  on scheduling an appointment as symptoms are stating to improve.

## 2024-07-27 ENCOUNTER — Other Ambulatory Visit: Payer: Self-pay | Admitting: Urgent Care

## 2024-07-27 DIAGNOSIS — E782 Mixed hyperlipidemia: Secondary | ICD-10-CM
